# Patient Record
Sex: Male | Born: 1945 | Race: White | Hispanic: No | Marital: Married | Smoking: Former smoker
Health system: Southern US, Community
[De-identification: ages and names within clinical notes are randomized; demographics above are authoritative.]

## PROBLEM LIST (undated history)

## (undated) DIAGNOSIS — E785 Hyperlipidemia, unspecified: Secondary | ICD-10-CM

## (undated) DIAGNOSIS — I10 Essential (primary) hypertension: Secondary | ICD-10-CM

## (undated) DIAGNOSIS — E119 Type 2 diabetes mellitus without complications: Secondary | ICD-10-CM

## (undated) DIAGNOSIS — E559 Vitamin D deficiency, unspecified: Secondary | ICD-10-CM

## (undated) DIAGNOSIS — G629 Polyneuropathy, unspecified: Secondary | ICD-10-CM

## (undated) HISTORY — PX: PROSTATE SURGERY: SHX751

## (undated) HISTORY — PX: REPLACEMENT TOTAL KNEE BILATERAL: SUR1225

## (undated) HISTORY — DX: Type 2 diabetes mellitus without complications: E11.9

## (undated) HISTORY — PX: COLONOSCOPY: SHX174

## (undated) HISTORY — DX: Vitamin D deficiency, unspecified: E55.9

## (undated) HISTORY — PX: OTHER SURGICAL HISTORY: SHX169

## (undated) HISTORY — PX: HERNIA REPAIR: SHX51

## (undated) HISTORY — DX: Essential (primary) hypertension: I10

## (undated) HISTORY — PX: GASTRIC BYPASS: SHX52

## (undated) HISTORY — DX: Hyperlipidemia, unspecified: E78.5

## (undated) HISTORY — DX: Polyneuropathy, unspecified: G62.9

---

## 2010-07-29 ENCOUNTER — Ambulatory Visit (HOSPITAL_COMMUNITY): Payer: BC Managed Care – PPO

## 2010-07-29 ENCOUNTER — Observation Stay (HOSPITAL_COMMUNITY)
Admission: RE | Admit: 2010-07-29 | Discharge: 2010-07-30 | Disposition: A | Discharge status: Home / Self Care | Payer: BC Managed Care – PPO | Source: Ambulatory Visit | Attending: Ophthalmology | Admitting: Ophthalmology

## 2010-07-29 DIAGNOSIS — E119 Type 2 diabetes mellitus without complications: Secondary | ICD-10-CM | POA: Insufficient documentation

## 2010-07-29 DIAGNOSIS — H33009 Unspecified retinal detachment with retinal break, unspecified eye: Principal | ICD-10-CM | POA: Insufficient documentation

## 2010-07-29 DIAGNOSIS — I10 Essential (primary) hypertension: Secondary | ICD-10-CM | POA: Insufficient documentation

## 2010-07-29 LAB — CBC
HCT: 45.5 % (ref 39.0–52.0)
Hemoglobin: 17.2 g/dL — ABNORMAL HIGH (ref 13.0–17.0)
MCHC: 37.8 g/dL — ABNORMAL HIGH (ref 30.0–36.0)
MCV: 86 fL (ref 78.0–100.0)
RDW: 12.9 % (ref 11.5–15.5)

## 2010-07-29 LAB — BASIC METABOLIC PANEL
BUN: 14 mg/dL (ref 6–23)
Chloride: 99 mEq/L (ref 96–112)
GFR calc non Af Amer: >60 mL/min (ref >60)
Glucose, Bld: 151 mg/dL — ABNORMAL HIGH (ref 70–99)
Potassium: 4.1 mEq/L (ref 3.5–5.1)
Sodium: 136 mEq/L (ref 135–145)

## 2010-07-29 LAB — GLUCOSE, CAPILLARY: Glucose-Capillary: 287 mg/dL — ABNORMAL HIGH (ref 70–99)

## 2010-07-29 LAB — SURGICAL PCR SCREEN
MRSA, PCR: NEGATIVE
Staphylococcus aureus: NEGATIVE

## 2010-07-30 LAB — GLUCOSE, CAPILLARY
Glucose-Capillary: 189 mg/dL — ABNORMAL HIGH (ref 70–99)
Glucose-Capillary: 218 mg/dL — ABNORMAL HIGH (ref 70–99)

## 2010-08-20 NOTE — Op Note (Signed)
  NAMEHAZAEL, Blake Castro                ACCOUNT NO.:  [id number]  MEDICAL RECORD NO.:  [id number]           PATIENT TYPE:  O  LOCATION:  5158                         FACILITY:  [facility]  PHYSICIAN:  Beulah Gandy. Ashley Royalty, M.D. 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  PROCEDURE:  Scleral buckle left eye, retinal photocoagulation left eye, gas fluid exchange left eye.  SURGEON:  Beulah Gandy. Ashley Royalty, MD  ASSISTANT:  Rosalie Doctor, MA  ANESTHESIA:  General.  DETAILS:  Usual prep and drape, 360-degree limbal peritomy, isolation of 4 rectus muscles.  Localization of break in the upper temporal quadrant. Scleral dissection for 360 degrees to admit a #279 intrascleral implant. Diathermy placed in the bed.  279 implant placed with a joint at 11 o'clock.  240 band placed around the eye with a 270 sleeve at 11 o'clock.  Perforation site chosen at 6 o'clock.  Moderate amount of clear colorless subretinal fluid came forth.  Once the eye became soft, 1 mL of sterile room air was injected into the vitreous cavity to reinflate the globe.  Additional fluid flowed at that point and finally pigment came from the perforation site.  The scleral buckle elements were placed and the scleral flaps were closed with two interrupted 4-0 Mersilene sutures per quadrant for a total of 8 sutures.  The buckle was adjusted and trimmed.  The band was adjusted and trimmed.  The sutures were knotted and the free ends removed.  Indirect ophthalmoscopy showed the retina to be lying nicely on the scleral buckle with minimal subretinal fluid remaining at the 9 o'clock location.  The indirect ophthalmoscope laser was moved into place, 959 burns were placed around the retinal periphery on the scleral buckle, power was between 300 and 360 milliwatts, 1000 microns  each and 0.1 seconds each.  The conjunctiva was reapproximated with 7-0 chromic suture.  Polymyxin and gentamicin were irrigated into [place] space.  Atropine solution was applied. Marcaine was injected around the globe for postop pain.  Decadron 10 mg was injected into the lower subconjunctival space.  Closing pressure was 15 with a Baer keratometer.  Polysporin ophthalmic ointment, patch and shield were placed.  The patient was awakened and taken to the recovery room in a satisfactory condition.  COMPLICATIONS:  None.  DURATION:  2 hours.     Beulah Gandy. Ashley Royalty, M.D.     JDM/MEDQ  D:  07/29/2010  T:  07/30/2010  Job:  [id number]  Electronically Signed by Alan Mulder M.D. on 08/20/2010 06:41:58 AM

## 2011-09-17 IMAGING — CR DG CHEST 2V
2 series · 2 of 2 positions shown · non-contrast
Comparison: None.

CLINICAL DATA: 64-year-old male preoperative study, hypertension.

CHEST - 2 VIEW

[view not recorded (1 of 2)]
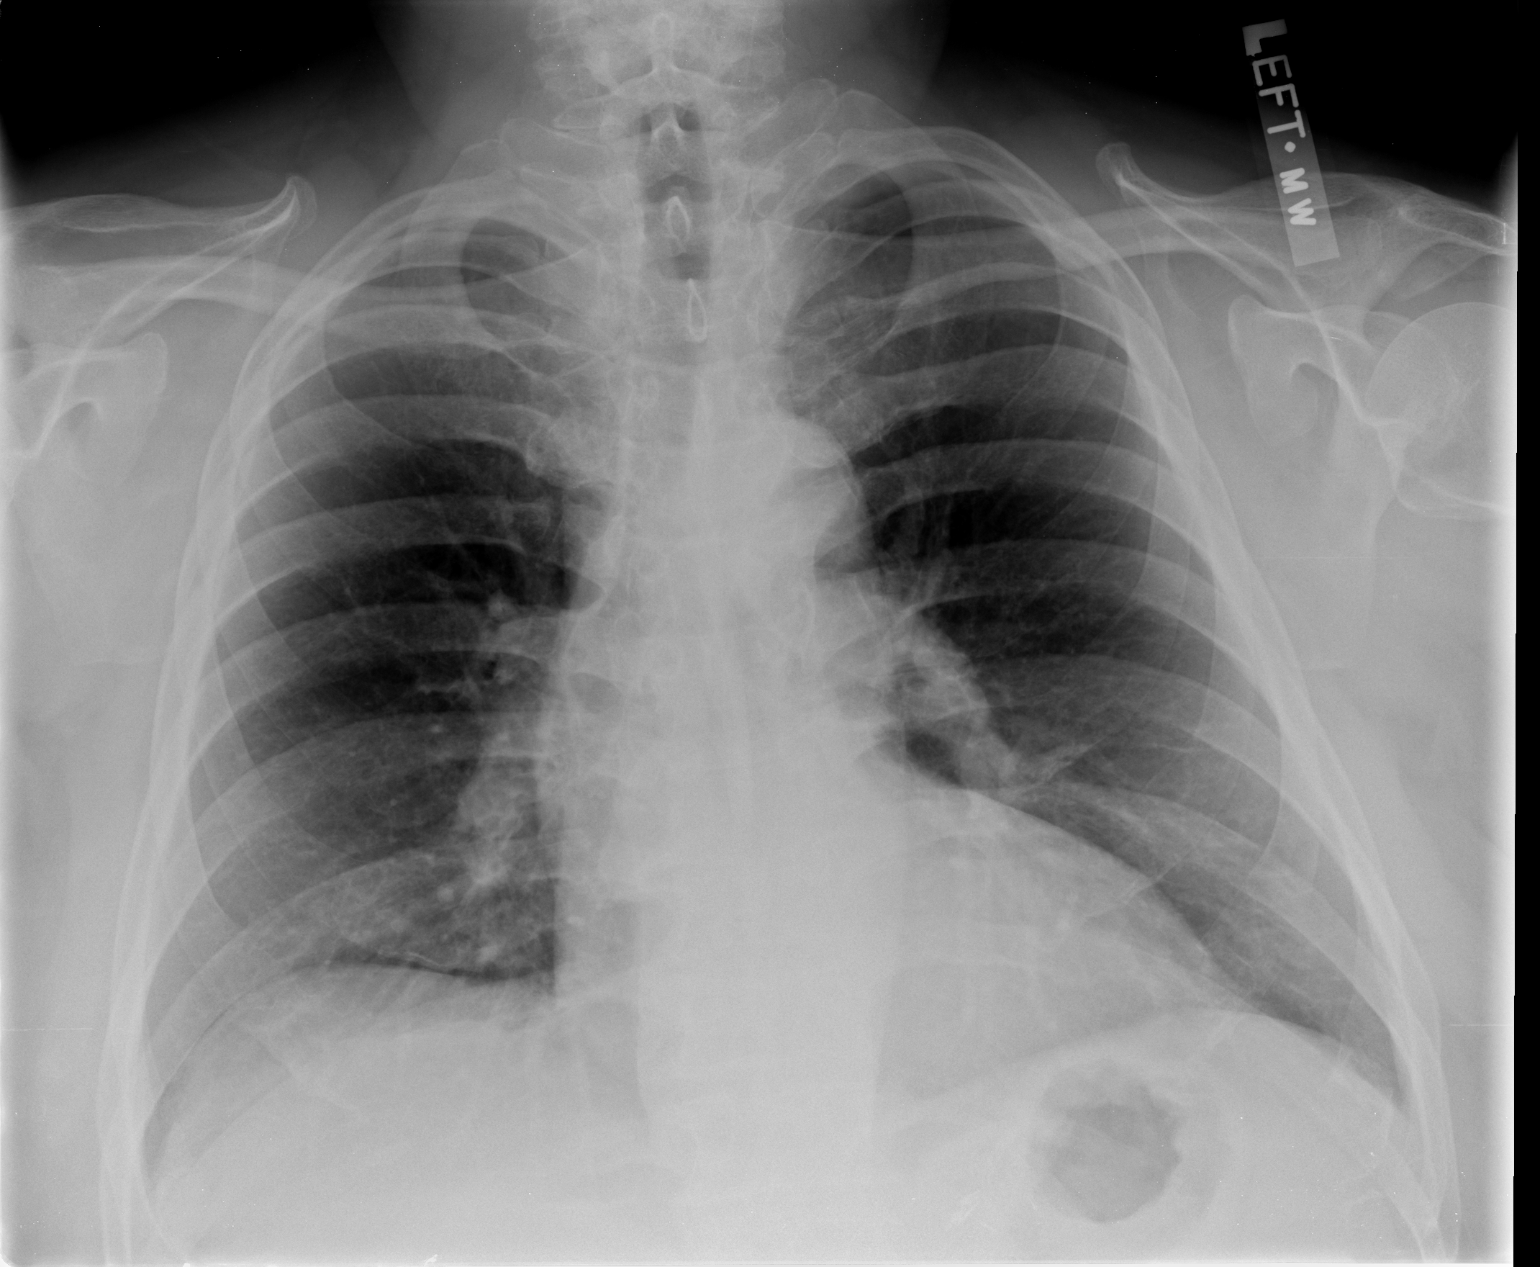

[view not recorded (2 of 2)]
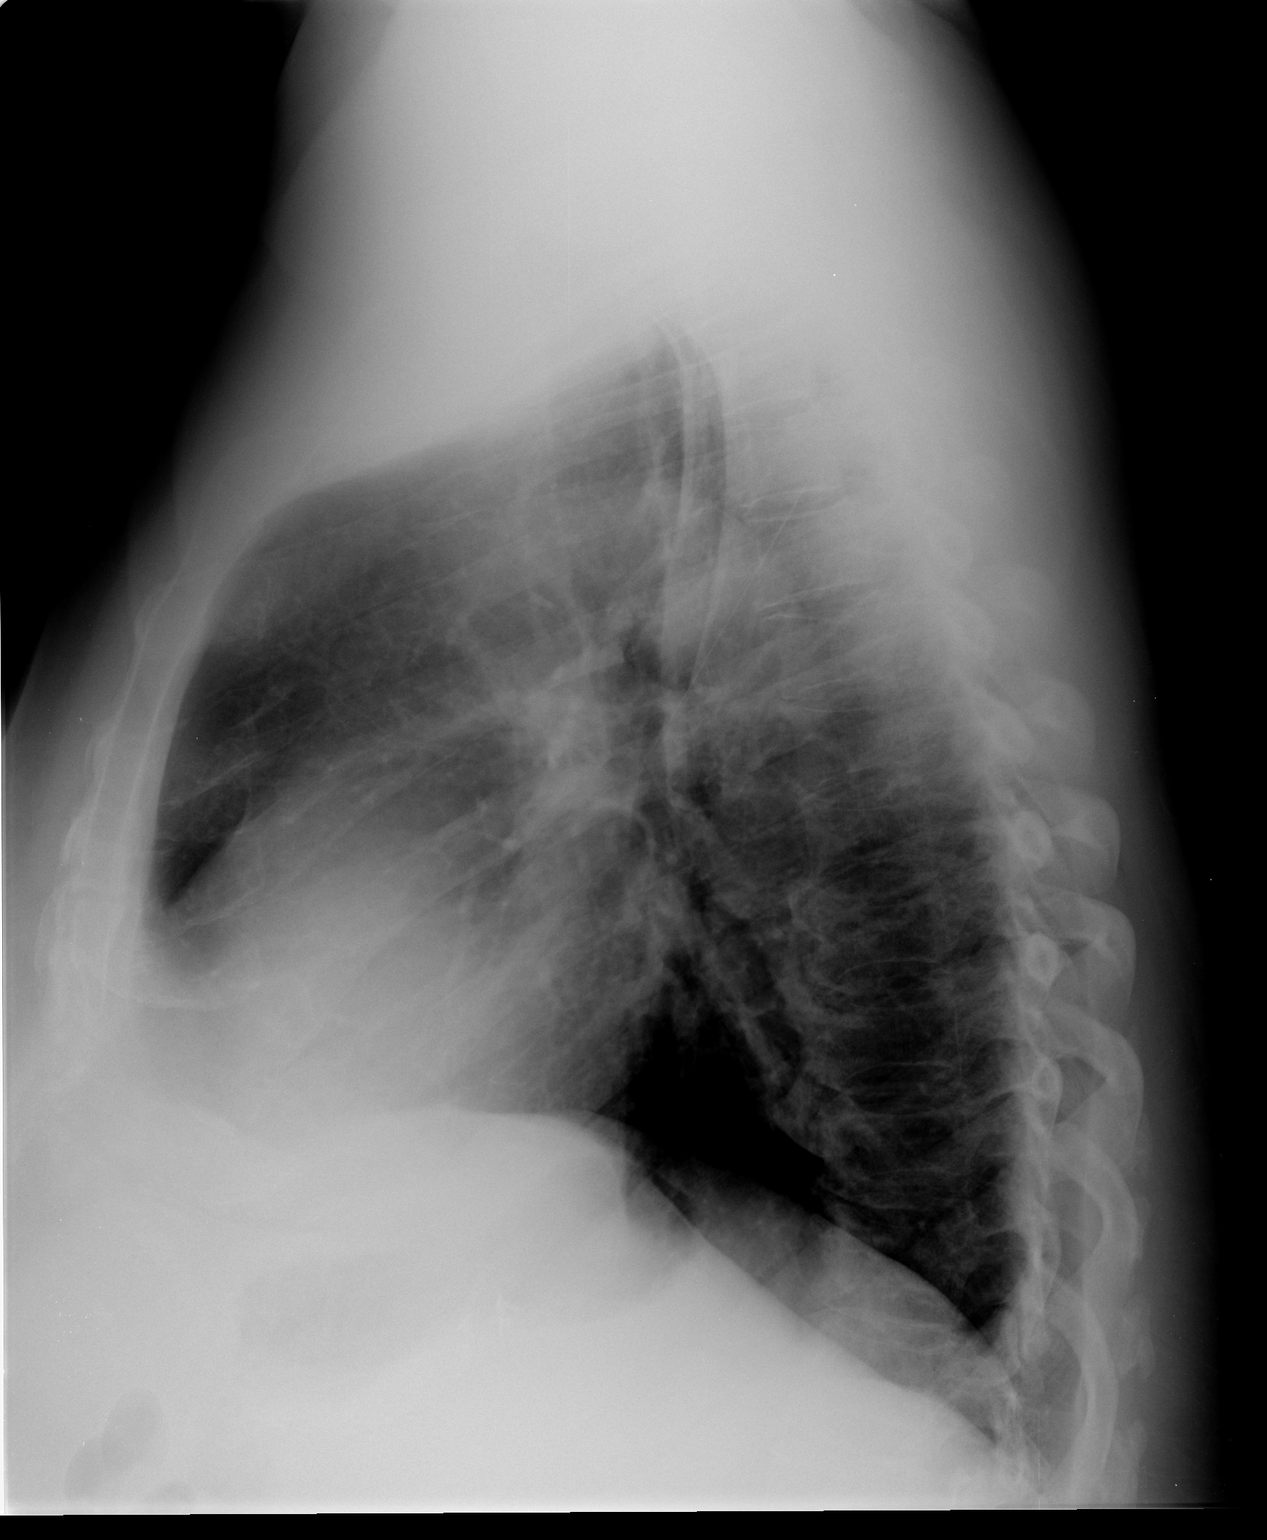

[2 of 2 positions shown; findings below may reference images not displayed]

FINDINGS: Lung volumes are within normal limits.  Cardiac size and
mediastinal contours are within normal limits.  Visualized tracheal
air column is within normal limits.  No pneumothorax, pulmonary
edema, pleural effusion or confluent pulmonary opacity.
Degenerative changes in the lower thoracic spine. No acute osseous
abnormality identified.
IMPRESSION: No acute cardiopulmonary abnormality.

## 2019-01-01 LAB — BASIC METABOLIC PANEL
BUN: 12 (ref 4–21)
Creatinine: 0.6 (ref 0.6–1.3)

## 2019-01-01 LAB — LIPID PANEL
Cholesterol: 102 (ref 0–200)
HDL: 34 — AB (ref 35–70)
LDL Cholesterol: 44
Triglycerides: 121 (ref 40–160)

## 2019-01-01 LAB — TSH: TSH: 3.1 (ref 0.41–5.90)

## 2019-01-01 LAB — HEMOGLOBIN A1C: Hemoglobin A1C: 6.1

## 2019-06-27 ENCOUNTER — Other Ambulatory Visit: Payer: Self-pay

## 2019-06-27 ENCOUNTER — Encounter: Payer: Self-pay | Admitting: "Endocrinology

## 2019-06-27 ENCOUNTER — Ambulatory Visit (INDEPENDENT_AMBULATORY_CARE_PROVIDER_SITE_OTHER): Payer: Medicare Other | Admitting: "Endocrinology

## 2019-06-27 VITALS — BP 127/75 | HR 75 | Ht 69.0 in | Wt 265.0 lb

## 2019-06-27 DIAGNOSIS — E042 Nontoxic multinodular goiter: Secondary | ICD-10-CM | POA: Insufficient documentation

## 2019-06-27 NOTE — Progress Notes (Signed)
Endocrinology Consult Note                                            06/27/2019, 9:17 PM   Subjective:    Patient ID: Blake Castro, male    DOB: Sep 04, 1945, PCP Jacqualine Code, DO   Past Medical History:  Diagnosis Date  . Diabetes mellitus, type II ([place])   . Hyperlipidemia   . Hyperlipidemia   . Hypertension   . Neuropathy   . Vitamin D deficiency    Past Surgical History:  Procedure Laterality Date  . cataract surgery    . COLONOSCOPY    . GASTRIC BYPASS    . HERNIA REPAIR    . operation on retina    . PROSTATE SURGERY    . REPLACEMENT TOTAL KNEE BILATERAL     Social History   Socioeconomic History  . Marital status: Married    Spouse name: Not on file  . Number of children: Not on file  . Years of education: Not on file  . Highest education level: Not on file  Occupational History  . Not on file  Tobacco Use  . Smoking status: Not on file  Substance and Sexual Activity  . Alcohol use: Not on file  . Drug use: Not on file  . Sexual activity: Not on file  Other Topics Concern  . Not on file  Social History Narrative  . Not on file   Social Determinants of Health   Financial Resource Strain:   . Difficulty of Paying Living Expenses: Not on file  Food Insecurity:   . Worried About Charity fundraiser in the Last Year: Not on file  . Ran Out of Food in the Last Year: Not on file  Transportation Needs:   . Lack of Transportation (Medical): Not on file  . Lack of Transportation (Non-Medical): Not on file  Physical Activity:   . Days of Exercise per Week: Not on file  . Minutes of Exercise per Session: Not on file  Stress:   . Feeling of Stress : Not on file  Social Connections:   . Frequency of Communication with Friends and Family: Not on file  . Frequency of Social Gatherings with Friends and Family: Not on file  . Attends Religious Services: Not on file  . Active Member of Clubs or Organizations: Not on file  . Attends Theatre manager Meetings: Not on file  . Marital Status: Not on file   Family History  Problem Relation Age of Onset  . Cancer Mother   . Diabetes Mother    Outpatient Encounter Medications as of 06/27/2019  Medication Sig  . Multiple Vitamins-Minerals (MULTIVITAMIN WITH MINERALS) tablet Take 1 tablet by mouth daily.  . Turmeric (QC TUMERIC COMPLEX) 500 MG CAPS Take 1 capsule by mouth daily.  Marland Kitchen amLODipine (NORVASC) 5 MG tablet Take 5 mg by mouth daily.  . carvedilol (COREG) 6.25 MG tablet Take 6.25 mg by mouth 2 (two) times daily.  Marland Kitchen lisinopril (ZESTRIL) 2.5 MG tablet Take 2.5 mg by mouth daily.  . Omega-3 1000 MG CAPS Take by mouth.  . pravastatin (PRAVACHOL) 10 MG tablet Take 10 mg by mouth daily.  . Semaglutide,0.25 or 0.5MG/DOS, (OZEMPIC, 0.25 OR 0.5 MG/DOSE,) 2 MG/1.5ML SOPN Ozempic 0.25 mg or 0.5 mg (2 mg/1.5 mL) subcutaneous pen injector  Inject 0.5 mg every week by subcutaneous route for 90 days.  Blake Castro 100 UNIT/ML Castro Pen Inject 40 Units into the skin at bedtime.    No facility-administered encounter medications on file as of 06/27/2019.   ALLERGIES: Allergies  Allergen Reactions  . Niacin And Related     VACCINATION STATUS:  There is no immunization history on file for this patient.  HPI Blake Castro is 74 y.o. male who presents today with a medical history as above. he is being seen in consultation for multinodular goiter requested by Jacqualine Code, DO. History is obtained directly from the patient.  He denies any prior history of thyroid dysfunction.  He was found to have clinical goiter recently.  Subsequently on June 11, 2019 he underwent thyroid ultrasound at an outside facility in [place].  The study showed goiter with 6.2 cm measuring right lobe which has 2 nodules measuring 2.8 and 0.7 cm.  Left lobe measured 5.4 cm.  Patient denies dysphagia, shortness of breath, no voice change.  He is TSH in September 2020 was normal.  Patient  denies any prior exposure to neck radiation.  He is currently not on any antithyroid medications nor thyroid hormone supplements. His medical history also includes well-controlled type 2 diabetes. He denies any recent significant weight change.  Review of Systems  Constitutional: no recent weight gain/loss, no fatigue, no subjective hyperthermia, no subjective hypothermia Eyes: no blurry vision, no xerophthalmia ENT: no sore throat, no nodules palpated in throat, no dysphagia/odynophagia, no hoarseness Cardiovascular: no Chest Pain, no Shortness of Breath, no palpitations, no leg swelling Respiratory: no cough, no shortness of breath Gastrointestinal: no Nausea/Vomiting/Diarhhea Musculoskeletal: no muscle/joint aches Skin: no rashes Neurological: no tremors, no numbness, no tingling, no dizziness Psychiatric: no depression, no anxiety  Objective:    Vitals with BMI 06/27/2019  Height 5' 9"   Weight 265 lbs  BMI 16.10  Systolic 960  Diastolic 75  Pulse 75    BP 127/75   Pulse 75   Ht 5' 9"  (1.753 m)   Wt 265 lb (120.2 kg)   BMI 39.13 kg/m   Wt Readings from Last 3 Encounters:  06/27/19 265 lb (120.2 kg)    Physical Exam  Constitutional:  Body mass index is 39.13 kg/m.,  not in acute distress, normal state of mind Eyes: PERRLA, EOMI, no exophthalmos ENT: moist mucous membranes, +  gross thyromegaly, no gross cervical lymphadenopathy Cardiovascular: normal precordial activity, Regular Rate and Rhythm, no Murmur/Rubs/Gallops Respiratory:  adequate breathing efforts, no gross chest deformity, Clear to auscultation bilaterally Gastrointestinal: abdomen soft, Non -tender, No distension, Bowel Sounds present, no gross organomegaly Musculoskeletal: no gross deformities, strength intact in all four extremities Skin: moist, warm, no rashes Neurological: no tremor with outstretched hands, Deep tendon reflexes normal in bilateral lower extremities.  CMP ( most recent) CMP      Component Value Date/Time   NA 136 07/29/2010 0942   K 4.1 07/29/2010 0942   CL 99 07/29/2010 0942   CO2 24 07/29/2010 0942   GLUCOSE 151 (H) 07/29/2010 0942   BUN 12 01/01/2019 0000   CREATININE 0.6 01/01/2019 0000   CREATININE 0.72 07/29/2010 0942   CALCIUM 9.7 07/29/2010 0942   GFRNONAA >60 07/29/2010 0942   GFRAA  07/29/2010 0942    >60        The eGFR has been calculated using the MDRD equation. This calculation has not been validated in all clinical situations. eGFR's persistently <60 mL/min signify possible Chronic  Kidney Disease.   TSH 3.1 on January 03, 2019. Lipid Panel     Component Value Date/Time   CHOL 102 01/01/2019 0000   TRIG 121 01/01/2019 0000   HDL 34 (A) 01/01/2019 0000   LDLCALC 44 01/01/2019 0000     Assessment & Plan:   1. Multinodular goiter - Anubis Fundora  is being seen at a kind request of Jacqualine Code, DO. - I have reviewed his available thyroid records and clinically evaluated the patient. - Based on these reviews, he has multinodular goiter with moderately suspicious 2.8 cm nodule on the right lobe which measures 6.2 cm,  however,  there is not sufficient information to proceed with definitive treatment plan.  - he will need a repeat full profile thyroid function test today, as well as fine-needle aspiration of the largest nodule on the right lobe.  He will return in 3 weeks with biopsy results.  If he has suspicious pathology findings, he will be considered for thyroidectomy.   - I did not initiate any new prescriptions today. - he is advised to maintain close follow up with Elvina Mattes Claudette Laws, DO for primary care needs.   - Time spent with the patient: 45 minutes, of which >50% was spent in  counseling him about his multinodular goiter and the rest in obtaining information about his symptoms, reviewing his previous labs/studies ( including abstractions from other facilities),  evaluations, and treatments,  and developing a  plan to confirm diagnosis and long term treatment based on the latest standards of care/guidelines; and documenting his care.  Lestine Box participated in the discussions, expressed understanding, and voiced agreement with the above plans.  All questions were answered to his satisfaction. he is encouraged to contact clinic should he have any questions or concerns prior to his return visit.  Follow up plan: Return in about 3 weeks (around 07/18/2019) for Labs Today- Non-Fasting Ok, Follow up with Biopsy Results.   Glade Lloyd, MD [facility] [address] [place], [place] [address] Phone: [phone]  Fax: [phone]     06/27/2019, 9:17 PM  This note was partially dictated with voice recognition software. Similar sounding words can be transcribed inadequately or may not  be corrected upon review.

## 2019-06-28 LAB — THYROGLOBULIN ANTIBODY: Thyroglobulin Ab: <1 IU/mL (ref <=1)

## 2019-06-28 LAB — T4, FREE: Free T4: 1.1 ng/dL (ref 0.8–1.8)

## 2019-06-28 LAB — TSH: TSH: 3.76 mIU/L (ref 0.40–4.50)

## 2019-06-28 LAB — THYROID PEROXIDASE ANTIBODY: Thyroperoxidase Ab SerPl-aCnc: 2 IU/mL (ref <9)

## 2019-06-28 LAB — T3, FREE: T3, Free: 3.1 pg/mL (ref 2.3–4.2)

## 2019-07-02 ENCOUNTER — Telehealth: Payer: Self-pay | Admitting: "Endocrinology

## 2019-07-02 NOTE — Telephone Encounter (Signed)
Refaxed order to [facility].

## 2019-07-02 NOTE — Telephone Encounter (Signed)
Pt said he has not heard from [facility] about scheduling his biopsy. He is scheduled to see dr nida again 3/2

## 2019-07-12 ENCOUNTER — Telehealth: Payer: Self-pay | Admitting: "Endocrinology

## 2019-07-12 NOTE — Telephone Encounter (Signed)
Left a message requesting a return call to the office. 

## 2019-07-12 NOTE — Telephone Encounter (Signed)
He did have thyroid ultrasound in [place]. Probably being scanned. See my note. If they can use u/s from outside facility, they can reschedule until we locate and give them a copy.

## 2019-07-12 NOTE — Telephone Encounter (Signed)
[facility] called and said that he is scheduled for his biopsy Tuesday, 3.23. per the radiologist they will have to cancel because they do not have an ultrasound on him. They need an order for an ultrasound first. Once that is done, they will do biopsy.   Please call pt to let him know this information (Joy)

## 2019-07-15 NOTE — Telephone Encounter (Signed)
Faxed a copy of pt's soft tissue ultrasound and order for thyroid biopsy to be rescheduled to [facility]. Pt made aware.

## 2019-07-15 NOTE — Telephone Encounter (Signed)
Pt left VM for you to return his call

## 2019-07-16 NOTE — Telephone Encounter (Signed)
Received a fax from [facility] stating biopsy has been performed.

## 2019-07-16 NOTE — Telephone Encounter (Signed)
Tristin with [facility] called and said he came to do his biopsy but they will not do it without history and physical. Call her at [phone]

## 2019-07-18 ENCOUNTER — Ambulatory Visit: Payer: Medicare Other | Admitting: "Endocrinology

## 2019-07-30 ENCOUNTER — Ambulatory Visit (INDEPENDENT_AMBULATORY_CARE_PROVIDER_SITE_OTHER): Payer: Medicare Other | Admitting: "Endocrinology

## 2019-07-30 ENCOUNTER — Encounter: Payer: Self-pay | Admitting: "Endocrinology

## 2019-07-30 ENCOUNTER — Other Ambulatory Visit: Payer: Self-pay

## 2019-07-30 VITALS — BP 146/82 | HR 68 | Ht 69.0 in | Wt 268.2 lb

## 2019-07-30 DIAGNOSIS — E042 Nontoxic multinodular goiter: Secondary | ICD-10-CM | POA: Diagnosis not present

## 2019-07-30 NOTE — Progress Notes (Signed)
07/30/2019, 8:53 AM  Endocrinology follow-up note   Subjective:    Patient ID: Blake Castro, male    DOB: 1945-11-15, PCP Jacqualine Code, DO   Past Medical History:  Diagnosis Date  . Diabetes mellitus, type II ([place])   . Hyperlipidemia   . Hyperlipidemia   . Hypertension   . Neuropathy   . Vitamin D deficiency    Past Surgical History:  Procedure Laterality Date  . cataract surgery    . COLONOSCOPY    . GASTRIC BYPASS    . HERNIA REPAIR    . operation on retina    . PROSTATE SURGERY    . REPLACEMENT TOTAL KNEE BILATERAL     Social History   Socioeconomic History  . Marital status: Married    Spouse name: Not on file  . Number of children: Not on file  . Years of education: Not on file  . Highest education level: Not on file  Occupational History  . Not on file  Tobacco Use  . Smoking status: Not on file  Substance and Sexual Activity  . Alcohol use: Not on file  . Drug use: Not on file  . Sexual activity: Not on file  Other Topics Concern  . Not on file  Social History Narrative  . Not on file   Social Determinants of Health   Financial Resource Strain:   . Difficulty of Paying Living Expenses:   Food Insecurity:   . Worried About Charity fundraiser in the Last Year:   . Arboriculturist in the Last Year:   Transportation Needs:   . Film/video editor (Medical):   Marland Kitchen Lack of Transportation (Non-Medical):   Physical Activity:   . Days of Exercise per Week:   . Minutes of Exercise per Session:   Stress:   . Feeling of Stress :   Social Connections:   . Frequency of Communication with Friends and Family:   . Frequency of Social Gatherings with Friends and Family:   . Attends Religious Services:   . Active Member of Clubs or Organizations:   . Attends Archivist Meetings:   Marland Kitchen Marital Status:    Family History  Problem Relation Age of Onset  . Cancer Mother   . Diabetes Mother     Outpatient Encounter Medications as of 07/30/2019  Medication Sig  . amLODipine (NORVASC) 5 MG tablet Take 5 mg by mouth daily.  . carvedilol (COREG) 6.25 MG tablet Take 6.25 mg by mouth 2 (two) times daily.  Marland Kitchen lisinopril (ZESTRIL) 2.5 MG tablet Take 2.5 mg by mouth daily.  . Multiple Vitamins-Minerals (MULTIVITAMIN WITH MINERALS) tablet Take 1 tablet by mouth daily.  . Omega-3 1000 MG CAPS Take by mouth.  . pravastatin (PRAVACHOL) 10 MG tablet Take 10 mg by mouth daily.  . Semaglutide,0.25 or 0.5MG/DOS, (OZEMPIC, 0.25 OR 0.5 MG/DOSE,) 2 MG/1.5ML SOPN Ozempic 0.25 mg or 0.5 mg (2 mg/1.5 mL) subcutaneous pen injector  Inject 0.5 mg every week by subcutaneous route for 90 days.  Tyler Aas FLEXTOUCH 100 UNIT/ML FlexTouch Pen Inject 40 Units into the skin at bedtime.   . Turmeric (QC TUMERIC COMPLEX) 500 MG CAPS Take 1 capsule by  mouth daily.   No facility-administered encounter medications on file as of 07/30/2019.   ALLERGIES: Allergies  Allergen Reactions  . Niacin And Related     VACCINATION STATUS:  There is no immunization history on file for this patient.  HPI Blake Castro is 74 y.o. male who presents today with a medical history as above. he is being seen in follow-up with his biopsy results after he was seen in consultation for multinodular goiter requested by Jacqualine Code, DO. His biopsy results are negative for malignancy.  He denies any prior history of thyroid dysfunction.  He was found to have clinical goiter recently.  Subsequently on June 11, 2019 he underwent thyroid ultrasound at an outside facility in [place].  The study showed goiter with 6.2 cm measuring right lobe which has 2 nodules measuring 2.8 and 0.7 cm.  Left lobe measured 5.4 cm.  Patient denies dysphagia, shortness of breath, no voice change.  He is previsit complete thyroid function tests are within normal limits.   Patient denies any prior exposure to neck radiation.  He is currently not on  any antithyroid medications nor thyroid hormone supplements. His medical history also includes well-controlled type 2 diabetes with reported A1c of 7% following with his primary care doctor. He denies any recent significant weight change.  Review of Systems  Constitutional: No significant recent weight change, no fatigue, no subjective hyperthermia, no subjective hypothermia Eyes: no blurry vision, no xerophthalmia ENT: no sore throat, no nodules palpated in throat, no dysphagia/odynophagia, no hoarseness Cardiovascular: no Chest Pain, no Shortness of Breath, no palpitations, no leg swelling Respiratory: no cough, no shortness of breath Gastrointestinal: no Nausea/Vomiting/Diarhhea Musculoskeletal: no muscle/joint aches Skin: no rashes Neurological: no tremors, no numbness, no tingling, no dizziness Psychiatric: no depression, no anxiety  Objective:    Vitals with BMI 07/30/2019 06/27/2019  Height 5' 9"  5' 9"   Weight 268 lbs 3 oz 265 lbs  BMI 46.56 81.27  Systolic 517 001  Diastolic 82 75  Pulse 68 75    BP (!) 146/82   Pulse 68   Ht 5' 9"  (1.753 m)   Wt 268 lb 3.2 oz (121.7 kg)   BMI 39.61 kg/m   Wt Readings from Last 3 Encounters:  07/30/19 268 lb 3.2 oz (121.7 kg)  06/27/19 265 lb (120.2 kg)    Physical Exam   Physical Exam- Limited  Constitutional:  Body mass index is 39.61 kg/m. , not in acute distress, normal state of mind Eyes:  EOMI, no exophthalmos Neck: Supple Thyroid:+ gross goiter Respiratory: Adequate breathing efforts Musculoskeletal: no gross deformities, strength intact in all four extremities, no gross restriction of joint movements Skin:  no rashes, no hyperemia Neurological: no tremor with outstretched hands,    CMP ( most recent) CMP     Component Value Date/Time   NA 136 07/29/2010 0942   K 4.1 07/29/2010 0942   CL 99 07/29/2010 0942   CO2 24 07/29/2010 0942   GLUCOSE 151 (H) 07/29/2010 0942   BUN 12 01/01/2019 0000   CREATININE 0.6  01/01/2019 0000   CREATININE 0.72 07/29/2010 0942   CALCIUM 9.7 07/29/2010 0942   GFRNONAA >60 07/29/2010 0942   GFRAA  07/29/2010 0942    >60        The eGFR has been calculated using the MDRD equation. This calculation has not been validated in all clinical situations. eGFR's persistently <60 mL/min signify possible Chronic Kidney Disease.   TSH 3.1 on January 03, 2019. Lipid  Panel     Component Value Date/Time   CHOL 102 01/01/2019 0000   TRIG 121 01/01/2019 0000   HDL 34 (A) 01/01/2019 0000   LDLCALC 44 01/01/2019 0000     July 16, 2019 thyroid fine-needle aspiration at [facility]  Negative for malignancy   Recent Results (from the past 2160 hour(s))  T4, free     Status: None   Collection Time: 06/27/19  1:53 PM  Result Value Ref Range   Free T4 1.1 0.8 - 1.8 ng/dL  T3, free     Status: None   Collection Time: 06/27/19  1:53 PM  Result Value Ref Range   T3, Free 3.1 2.3 - 4.2 pg/mL  TSH     Status: None   Collection Time: 06/27/19  1:53 PM  Result Value Ref Range   TSH 3.76 0.40 - 4.50 mIU/L  Thyroid peroxidase antibody     Status: None   Collection Time: 06/27/19  1:53 PM  Result Value Ref Range   Thyroperoxidase Ab SerPl-aCnc 2 <9 IU/mL  Thyroglobulin antibody     Status: None   Collection Time: 06/27/19  1:53 PM  Result Value Ref Range   Thyroglobulin Ab <1 < or = 1 IU/mL     Assessment & Plan:   1. Multinodular goiter - he has multinodular goiter with moderately suspicious 2.8 cm nodule on the right lobe which measures 6.2 cm.  -Biopsy of this nodule was negative for malignancy.  He will not need surgery nor further intervention at this time.  He is recent thyroid function tests are within normal limits, does not need thyroid hormone supplement. -He will return in 1 year with repeat thyroid function test and for physical exam, he may need surveillance thyroid ultrasound based on physical exam during his next visit.  - I did  not initiate any new prescriptions today.   - he is advised to maintain close follow up with Jacqualine Code, DO for he is controlled type 2 diabetes and other primary care needs.      - Time spent on this patient care encounter:  25 minutes of which 50% was spent in  counseling and the rest reviewing  his current and  previous labs / studies, biopsy results, and medications  doses and developing a plan for long term care. Lestine Box  participated in the discussions, expressed understanding, and voiced agreement with the above plans.  All questions were answered to his satisfaction. he is encouraged to contact clinic should he have any questions or concerns prior to his return visit.  Follow up plan: Return in about 1 year (around 07/29/2020) for Follow up with Pre-visit Labs, Next Visit A1c in Office.   Glade Lloyd, MD [facility] [address] [place], [place] [address] Phone: [phone]  Fax: [phone]     07/30/2019, 8:53 AM  This note was partially dictated with voice recognition software. Similar sounding words can be transcribed inadequately or may not  be corrected upon review.

## 2019-08-02 ENCOUNTER — Ambulatory Visit: Payer: Medicare Other | Admitting: "Endocrinology

## 2020-07-30 ENCOUNTER — Ambulatory Visit: Payer: Medicare Other | Admitting: "Endocrinology

## 2022-10-19 LAB — COMPREHENSIVE METABOLIC PANEL WITH GFR: eGFR: 100

## 2022-10-19 LAB — LIPID PANEL
LDL Cholesterol: 82
Triglycerides: 205 — AB (ref 40–160)

## 2022-10-19 LAB — BASIC METABOLIC PANEL WITH GFR
BUN: 13 (ref 4–21)
Creatinine: 0.6 (ref 0.6–1.3)
Glucose: 207

## 2022-10-19 LAB — TSH: TSH: 4.23 (ref 0.41–5.90)

## 2022-10-19 LAB — HEMOGLOBIN A1C: Hemoglobin A1C: 7.6

## 2022-10-19 LAB — MICROALBUMIN / CREATININE URINE RATIO: Microalb Creat Ratio: 15

## 2022-12-17 LAB — COLOGUARD: COLOGUARD: NEGATIVE

## 2022-12-17 LAB — EXTERNAL GENERIC LAB PROCEDURE: COLOGUARD: NEGATIVE

## 2023-01-20 LAB — HEMOGLOBIN A1C: Hemoglobin A1C: 7.5

## 2023-04-21 LAB — HEMOGLOBIN A1C: Hemoglobin A1C: 8.7

## 2023-07-20 LAB — LAB REPORT - SCANNED: A1c: 8.7

## 2023-08-30 NOTE — Patient Instructions (Signed)

## 2023-09-04 ENCOUNTER — Encounter: Payer: Self-pay | Admitting: Nurse Practitioner

## 2023-09-04 ENCOUNTER — Ambulatory Visit: Payer: Medicare HMO | Admitting: Nurse Practitioner

## 2023-09-04 VITALS — BP 128/80 | HR 75 | Ht 69.0 in | Wt 261.0 lb

## 2023-09-04 DIAGNOSIS — E782 Mixed hyperlipidemia: Secondary | ICD-10-CM | POA: Diagnosis not present

## 2023-09-04 DIAGNOSIS — Z794 Long term (current) use of insulin: Secondary | ICD-10-CM | POA: Diagnosis not present

## 2023-09-04 DIAGNOSIS — I10 Essential (primary) hypertension: Secondary | ICD-10-CM

## 2023-09-04 DIAGNOSIS — E559 Vitamin D deficiency, unspecified: Secondary | ICD-10-CM

## 2023-09-04 DIAGNOSIS — Z7984 Long term (current) use of oral hypoglycemic drugs: Secondary | ICD-10-CM | POA: Diagnosis not present

## 2023-09-04 DIAGNOSIS — E1165 Type 2 diabetes mellitus with hyperglycemia: Secondary | ICD-10-CM

## 2023-09-04 DIAGNOSIS — Z7985 Long-term (current) use of injectable non-insulin antidiabetic drugs: Secondary | ICD-10-CM | POA: Diagnosis not present

## 2023-09-04 DIAGNOSIS — E042 Nontoxic multinodular goiter: Secondary | ICD-10-CM

## 2023-09-04 LAB — POCT GLYCOSYLATED HEMOGLOBIN (HGB A1C): Hemoglobin A1C: 8.7 % — AB (ref 4.0–5.6)

## 2023-09-04 NOTE — Progress Notes (Signed)
 Endocrinology Consult Note       09/04/2023, 11:53 AM   Subjective:    Patient ID: Blake Castro, male    DOB: November 21, 1945.  Blake Castro is being seen in consultation for management of currently uncontrolled symptomatic diabetes requested by  Favero, John Patrick, DO.   Past Medical History:  Diagnosis Date   Diabetes mellitus, type II (HCC)    Hyperlipidemia    Hyperlipidemia    Hypertension    Neuropathy    Vitamin D deficiency     Past Surgical History:  Procedure Laterality Date   cataract surgery     COLONOSCOPY     GASTRIC BYPASS     HERNIA REPAIR     operation on retina     PROSTATE SURGERY     REPLACEMENT TOTAL KNEE BILATERAL      Social History   Socioeconomic History   Marital status: Married    Spouse name: Not on file   Number of children: Not on file   Years of education: Not on file   Highest education level: Not on file  Occupational History   Not on file  Tobacco Use   Smoking status: Former    Types: Cigarettes   Smokeless tobacco: Never  Substance and Sexual Activity   Alcohol use: Not Currently   Drug use: Never   Sexual activity: Not on file  Other Topics Concern   Not on file  Social History Narrative   Not on file   Social Drivers of Health   Financial Resource Strain: Not on file  Food Insecurity: Not on file  Transportation Needs: Not on file  Physical Activity: Not on file  Stress: Not on file  Social Connections: Not on file    Family History  Problem Relation Age of Onset   Cancer Mother    Diabetes Mother     Outpatient Encounter Medications as of 09/04/2023  Medication Sig   doxycycline (VIBRA-TABS) 100 MG tablet Take 100 mg by mouth 2 (two) times daily.   empagliflozin (JARDIANCE) 25 MG TABS tablet Take 25 mg by mouth daily.   Omega-3 1000 MG CAPS Take by mouth.   rosuvastatin (CRESTOR) 10 MG tablet Take 10 mg by mouth at bedtime.   SOLIQUA  100-33 UNT-MCG/ML SOPN Inject 68 Units into the skin at bedtime.   [DISCONTINUED] amLODipine (NORVASC) 5 MG tablet Take 5 mg by mouth daily. (Patient not taking: Reported on 09/04/2023)   [DISCONTINUED] carvedilol (COREG) 6.25 MG tablet Take 6.25 mg by mouth 2 (two) times daily. (Patient not taking: Reported on 09/04/2023)   [DISCONTINUED] lisinopril (ZESTRIL) 2.5 MG tablet Take 2.5 mg by mouth daily. (Patient not taking: Reported on 09/04/2023)   [DISCONTINUED] Multiple Vitamins-Minerals (MULTIVITAMIN WITH MINERALS) tablet Take 1 tablet by mouth daily. (Patient not taking: Reported on 09/04/2023)   [DISCONTINUED] pravastatin (PRAVACHOL) 10 MG tablet Take 10 mg by mouth daily. (Patient not taking: Reported on 09/04/2023)   [DISCONTINUED] Semaglutide,0.25 or 0.5MG /DOS, (OZEMPIC, 0.25 OR 0.5 MG/DOSE,) 2 MG/1.5ML SOPN Ozempic 0.25 mg or 0.5 mg (2 mg/1.5 mL) subcutaneous pen injector  Inject 0.5 mg every week by subcutaneous route for 90 days. (Patient not taking: Reported on 09/04/2023)   [  DISCONTINUED] TRESIBA FLEXTOUCH 100 UNIT/ML FlexTouch Pen Inject 40 Units into the skin at bedtime.  (Patient not taking: Reported on 09/04/2023)   [DISCONTINUED] Turmeric (QC TUMERIC COMPLEX) 500 MG CAPS Take 1 capsule by mouth daily. (Patient not taking: Reported on 09/04/2023)   No facility-administered encounter medications on file as of 09/04/2023.    ALLERGIES: Allergies  Allergen Reactions   Niacin And Related     VACCINATION STATUS:  There is no immunization history on file for this patient.  Diabetes He presents for his initial diabetic visit. He has type 2 diabetes mellitus. Onset time: diagnosed at approx age of 3. His disease course has been stable. There are no hypoglycemic associated symptoms. Associated symptoms include blurred vision and foot paresthesias. There are no hypoglycemic complications. Symptoms are worsening. Diabetic complications include heart disease (Afib) and peripheral neuropathy.  Risk factors for coronary artery disease include dyslipidemia, diabetes mellitus, family history, male sex, obesity, hypertension and sedentary lifestyle. Current diabetic treatment includes insulin injections and oral agent (monotherapy) (Soliqua and Jardiance). He is compliant with treatment most of the time. His weight is fluctuating minimally. He is following a generally unhealthy diet. When asked about meal planning, he reported none. He has not had a previous visit with a dietitian. He participates in exercise intermittently. His overall blood glucose range is 180-200 mg/dl. (He presents today for his consultation, accompanied by his wife, with his meter showing above target fasting glycemic profile overall.  His most recent A1c on 3/27 was 8.7%, unchanged from previous visit.  He notes his glucose was managing fine and then his PCP changed up his medications and he has struggled to get it back on track ever since.  He drinks tea, water, pepsi, and juice.  He eats 3 meals per day and does snack between.  He does engage in physical activity, doing yard work.  He is UTD on eye exam, has seen podiatry in the distant past as well.  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  Analysis of his meter shows 7-day average of 184, 14-day average of 171, 30-day average of 183, 90-day average of 204.) An ACE inhibitor/angiotensin II receptor blocker is being taken. He does not see a podiatrist.Eye exam is current.     Review of systems  Constitutional: + Minimally fluctuating body weight, current Body mass index is 38.54 kg/m., no fatigue, no subjective hyperthermia, no subjective hypothermia Eyes: + blurry vision, no xerophthalmia ENT: no sore throat, no nodules palpated in throat, no dysphagia/odynophagia, no hoarseness Cardiovascular: no chest pain, no shortness of breath, no palpitations, no leg swelling Respiratory: no cough, no shortness of breath Gastrointestinal: no  nausea/vomiting/diarrhea Musculoskeletal: no muscle/joint aches Skin: no rashes, no hyperemia Neurological: no tremors, + numbness/ tingling to BLE, no dizziness Psychiatric: no depression, no anxiety  Objective:      BP 128/80 (BP Location: Right Arm, Patient Position: Sitting, Cuff Size: Large)   Pulse 75   Ht 5\' 9"  (1.753 m)   Wt 261 lb (118.4 kg)   BMI 38.54 kg/m   Wt Readings from Last 3 Encounters:  09/04/23 261 lb (118.4 kg)  07/30/19 268 lb 3.2 oz (121.7 kg)  06/27/19 265 lb (120.2 kg)     BP Readings from Last 3 Encounters:  09/04/23 128/80  07/30/19 (!) 146/82  06/27/19 127/75     Physical Exam- Limited  Constitutional:  Body mass index is 38.54 kg/m. , not in acute distress, normal state of mind Eyes:  EOMI, no exophthalmos Neck: Supple Cardiovascular: RRR, no murmurs, rubs, or gallops, no edema Respiratory: Adequate breathing efforts, no crackles, rales, rhonchi, or wheezing Musculoskeletal: no gross deformities, strength intact in all four extremities, no gross restriction of joint movements Skin:  no rashes, no hyperemia Neurological: no tremor with outstretched hands   Diabetic Foot Exam - Simple   No data filed      CMP ( most recent) CMP     Component Value Date/Time   NA 136 07/29/2010 0942   K 4.1 07/29/2010 0942   CL 99 07/29/2010 0942   CO2 24 07/29/2010 0942   GLUCOSE 151 (H) 07/29/2010 0942   BUN 13 10/19/2022 0000   CREATININE 0.6 10/19/2022 0000   CREATININE 0.72 07/29/2010 0942   CALCIUM 9.7 07/29/2010 0942   EGFR 100 10/19/2022 0000   GFRNONAA >60 07/29/2010 0942     Diabetic Labs (most recent): Lab Results  Component Value Date   HGBA1C 8.7 (A) 09/04/2023   HGBA1C 8.7 04/21/2023   HGBA1C 7.5 01/20/2023     Lipid Panel ( most recent) Lipid Panel     Component Value Date/Time   CHOL 102 01/01/2019 0000   TRIG 205 (A) 10/19/2022 0000   HDL 34 (A) 01/01/2019 0000   LDLCALC 82 10/19/2022 0000      Lab Results   Component Value Date   TSH 4.23 10/19/2022   TSH 3.76 06/27/2019   TSH 3.10 01/01/2019   FREET4 1.1 06/27/2019           Assessment & Plan:   1) Type 2 diabetes mellitus with hyperglycemia, with long-term current use of insulin (HCC) (Primary)  He presents today for his consultation, accompanied by his wife, with his meter showing above target fasting glycemic profile overall.  His most recent A1c on 3/27 was 8.7%, unchanged from previous visit.  He notes his glucose was managing fine and then his PCP changed up his medications and he has struggled to get it back on track ever since.  He drinks tea, water, pepsi, and juice.  He eats 3 meals per day and does snack between.  He does engage in physical activity, doing yard work.  He is UTD on eye exam, has seen podiatry in the distant past as well.  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  Analysis of his meter shows 7-day average of 184, 14-day average of 171, 30-day average of 183, 90-day average of 204.  - Blake Castro has currently uncontrolled symptomatic type 2 DM since 78 years of age, with most recent A1c of 8.7 %.   -Recent labs reviewed.  - I had a long discussion with him about the progressive nature of diabetes and the pathology behind its complications. -his diabetes is complicated by peripheral neuropathy and he remains at a high risk for more acute and chronic complications which include CAD, CVA, CKD, retinopathy, and neuropathy. These are all discussed in detail with him.  The following Lifestyle Medicine recommendations according to American College of Lifestyle Medicine [facility]) were discussed and offered to patient and he agrees to start the journey:  A. Whole Foods, Plant-based plate comprising of fruits and vegetables, plant-based proteins, whole-grain carbohydrates was discussed in detail with the patient.   A list for source of those nutrients were also provided to the patient.  Patient will use only water or  unsweetened tea for hydration. B.  The need to stay away from risky substances including alcohol, smoking; obtaining 7  to 9 hours of restorative sleep, at least 150 minutes of moderate intensity exercise weekly, the importance of healthy social connections,  and stress reduction techniques were discussed. C.  A full color page of Calorie density of various food groups per pound showing examples of each food groups was provided to the patient.  - I have counseled him on diet and weight management by adopting a carbohydrate restricted/protein rich diet. Patient is encouraged to switch to unprocessed or minimally processed complex starch and increased protein intake (animal or plant source), fruits, and vegetables. -  he is advised to stick to a routine mealtimes to eat 3 meals a day and avoid unnecessary snacks (to snack only to correct hypoglycemia).   - he acknowledges that there is a room for improvement in his food and drink choices. - Suggestion is made for him to avoid simple carbohydrates from his diet including Cakes, Sweet Desserts, Ice Cream, Soda (diet and regular), Sweet Tea, Candies, Chips, Cookies, Store Bought Juices, Alcohol in Excess of 1-2 drinks a day, Artificial Sweeteners, Coffee Creamer, and "Sugar-free" Products. This will help patient to have more stable blood glucose profile and potentially avoid unintended weight gain.  - I have approached him with the following individualized plan to manage his diabetes and patient agrees:   -He is advised to continue his Soliqua 68 units SQ nightly and Jardiance 25 mg po daily.  We did Castro over potential side effects of SGLT2i medications to watch for.  -he is encouraged to start/continue monitoring glucose 2 times daily, before breakfast and before bed, to log their readings on the clinic sheets provided, and bring them to review at follow up appointment in 3 months.  - he is warned not to take insulin without proper monitoring per  orders. - Adjustment parameters are given to him for hypo and hyperglycemia in writing. - he is encouraged to call clinic for blood glucose levels less than 70 or above 300 mg /dl.  - Specific targets for  A1c; LDL, HDL, and Triglycerides were discussed with the patient.  2) Blood Pressure /Hypertension:  his blood pressure is controlled to target.   he is advised to continue his current medications as prescribed by his PCP.  3) Lipids/Hyperlipidemia:    Review of his recent lipid panel from 10/19/22 showed controlled LDL at 82 and elevated triglycerides of 205 .  he is advised to continue Crestor 10 mg daily at bedtime.  Side effects and precautions discussed with him.  4)  Weight/Diet:  his Body mass index is 38.54 kg/m.  -  clearly complicating his diabetes care.   he is a candidate for weight loss. I discussed with him the fact that loss of 5 - 10% of his  current body weight will have the most impact on his diabetes management.  Exercise, and detailed carbohydrates information provided  -  detailed on discharge instructions.  5) Chronic Care/Health Maintenance: -he is on ACEI/ARB and Statin medications and is encouraged to initiate and continue to follow up with Ophthalmology, Dentist, Podiatrist at least yearly or according to recommendations, and advised to stay away from smoking. I have recommended yearly flu vaccine and pneumonia vaccine at least every 5 years; moderate intensity exercise for up to 150 minutes weekly; and sleep for at least 7 hours a day.  6) Multinodular Goiter Was previously seen by my colleague Dr. Monte Antonio in 2021.  He did have suspicious thyroid  nodule, had FNA which favored benignity.  Has not had any  imaging studies since 2021.  He denies any new symptoms or complications.     - he is advised to maintain close follow up with Favero, Ramey Pajaros, DO for primary care needs, as well as his other providers for optimal and coordinated care.   - Time spent in this  patient care: 60 min, which was spent in counseling him about his diabetes and the rest reviewing his blood glucose logs, discussing his hypoglycemia and hyperglycemia episodes, reviewing his current and previous labs/studies (including abstraction from other facilities) and medications doses and developing a long term treatment plan based on the latest standards of care/guidelines; and documenting his care.    Please refer to Patient Instructions for Blood Glucose Monitoring and Insulin/Medications Dosing Guide" in media tab for additional information. Please also refer to "Patient Self Inventory" in the Media tab for reviewed elements of pertinent patient history.  Blake Castro participated in the discussions, expressed understanding, and voiced agreement with the above plans.  All questions were answered to his satisfaction. he is encouraged to contact clinic should he have any questions or concerns prior to his return visit.     Follow up plan: - Return in about 3 months (around 12/05/2023) for Diabetes F/U with A1c in office, No previsit labs, Bring meter and logs.    Hulon Magic, [facility] [address] [place], [place] [address] Phone: [phone] Fax: [phone]  09/04/2023, 11:53 AM

## 2023-11-14 ENCOUNTER — Telehealth: Payer: Self-pay | Admitting: Nurse Practitioner

## 2023-11-14 NOTE — Telephone Encounter (Signed)
 Pt came to appt on 11/14/23, appt was scheduled for 12/15/23.  Pt stated to cancel his appt and did not want to reschedule that he would not be returning.

## 2023-12-15 ENCOUNTER — Ambulatory Visit: Admitting: Nurse Practitioner
